# Patient Record
Sex: Female | Born: 1975 | Race: White | Hispanic: Yes | Marital: Married | Smoking: Never smoker
Health system: Southern US, Community
[De-identification: ages and names within clinical notes are randomized; demographics above are authoritative.]

## PROBLEM LIST (undated history)

## (undated) DIAGNOSIS — N809 Endometriosis, unspecified: Secondary | ICD-10-CM

---

## 2007-08-07 ENCOUNTER — Inpatient Hospital Stay (HOSPITAL_COMMUNITY): Admission: EM | Admit: 2007-08-07 | Discharge: 2007-08-11 | Payer: Self-pay | Admitting: Emergency Medicine

## 2009-06-04 ENCOUNTER — Emergency Department (HOSPITAL_COMMUNITY): Admission: EM | Admit: 2009-06-04 | Discharge: 2009-06-04 | Payer: Self-pay | Admitting: Emergency Medicine

## 2009-07-19 IMAGING — CT CT ABDOMEN W/ CM
2 of 5 series · 11 of 36 positions shown, 18 images · IV contrast (agent unspecified)
Comparison: None

CT ABDOMEN:

CLINICAL DATA: Abdominal pain radiating to back with nausea,
vomiting, diarrhea and dysuria the patient reportedly has fevers
with normal white blood count and abdominal pain.

CT ABDOMEN WITH CONTRAST,CT PELVIS WITH CONTRAST
TECHNIQUE: Multidetector CT imaging of the abdomen was performed
following the standard protocol during bolus administration of
intravenous contrast.,Technique:  Multidetector CT imaging of the
pelvis was performed following the standard protocol during
Contrast: 100 ml Wmnipaque-UII

[Series 2: routine abdomen · axial · 0.64mm/px · z∈[-438,-88]mm · 10 of 86 slices shown, 16 images]
[im 8/86  soft-tissue]
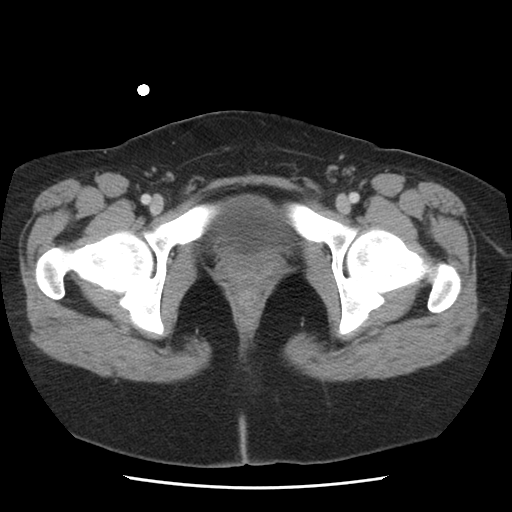
[im 8/86  bone]
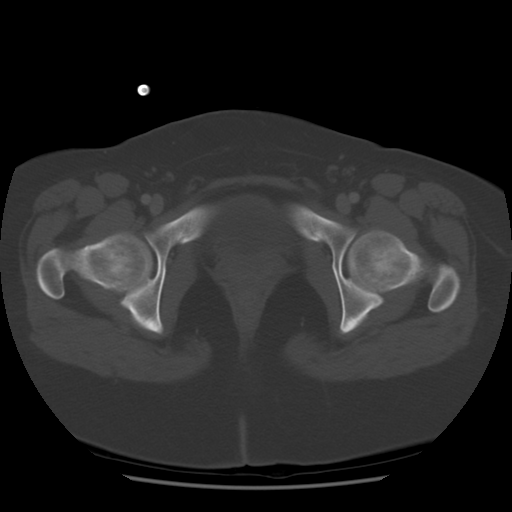
[im 15/86  soft-tissue]
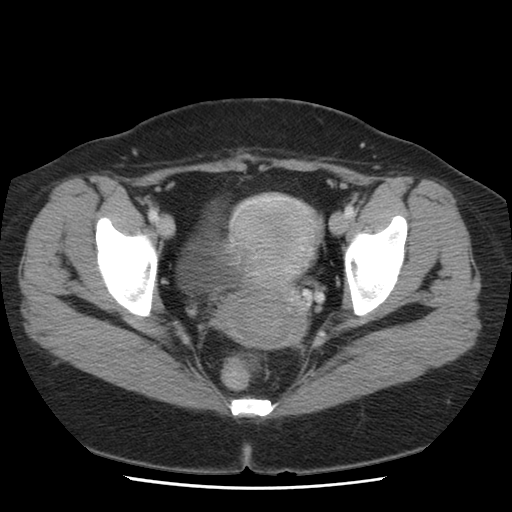
[im 22/86  soft-tissue]
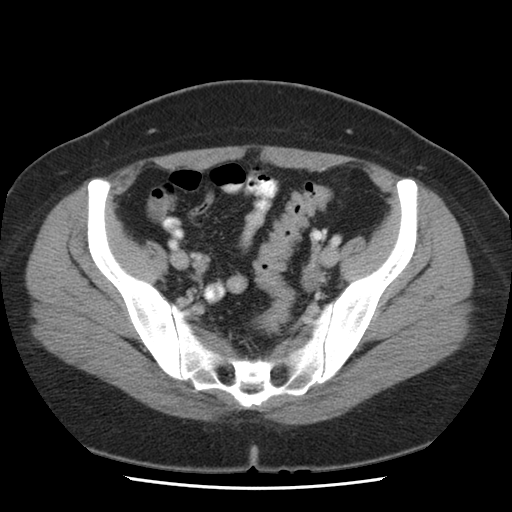
[im 29/86  soft-tissue]
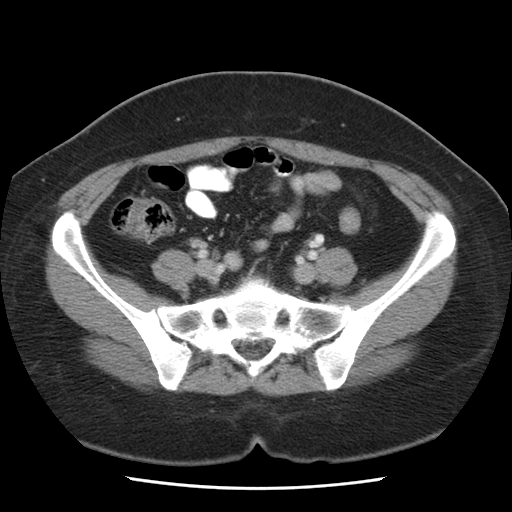
[im 36/86  soft-tissue]
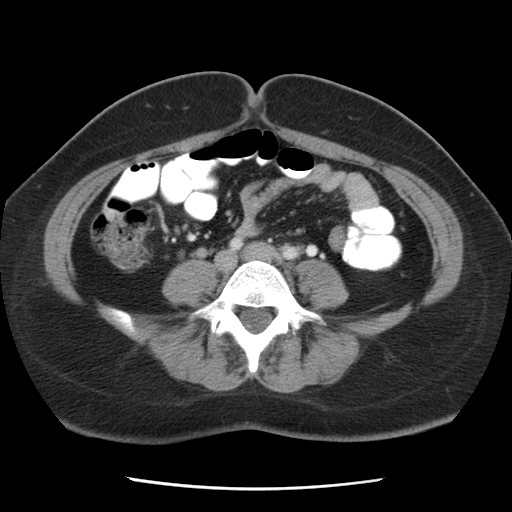
[im 50/86  soft-tissue]
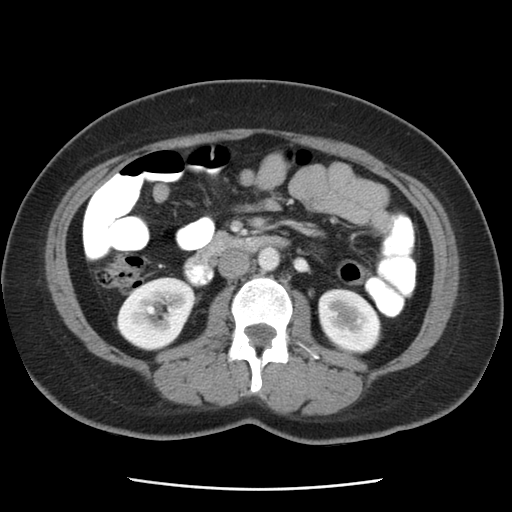
[im 57/86  soft-tissue]
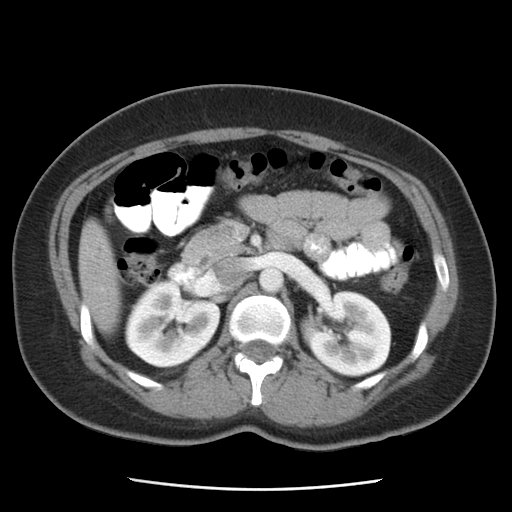
[im 57/86  lung]
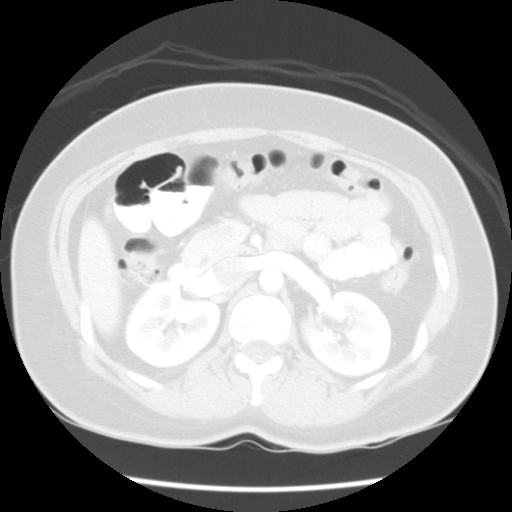
[im 64/86  soft-tissue]
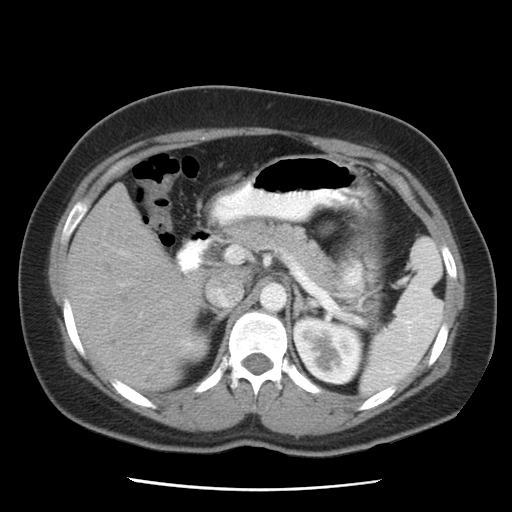
[im 64/86  lung]
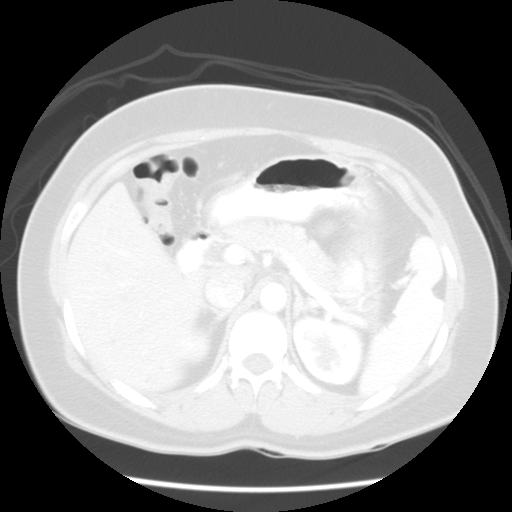
[im 71/86  soft-tissue]
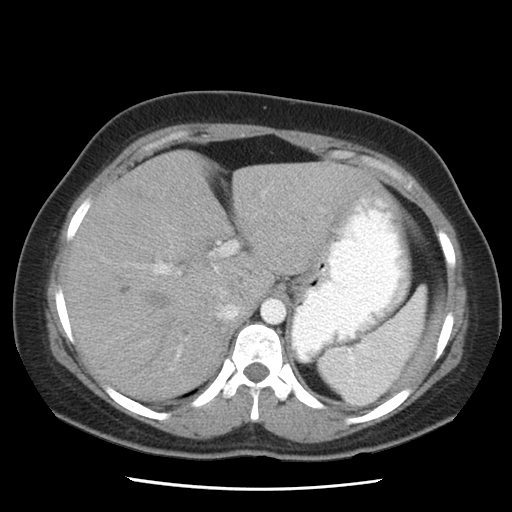
[im 71/86  lung]
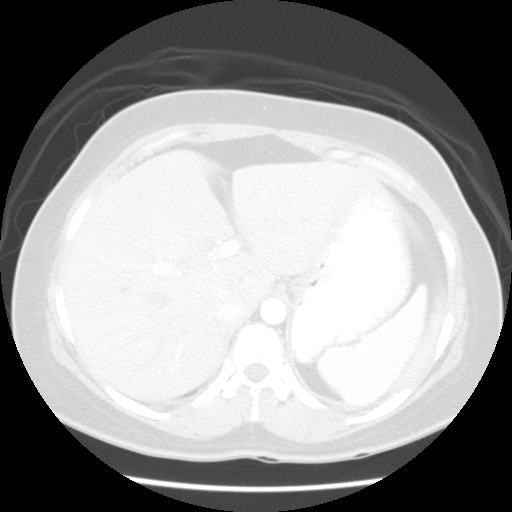
[im 71/86  bone]
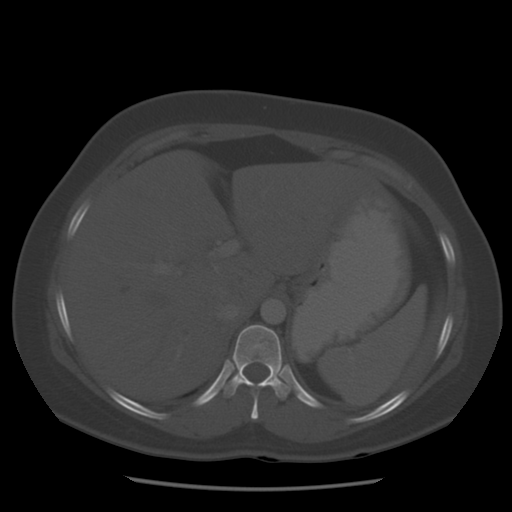
[im 78/86  soft-tissue]
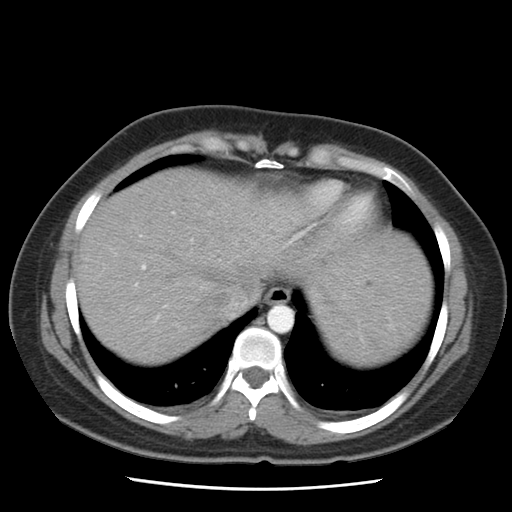
[im 78/86  lung]
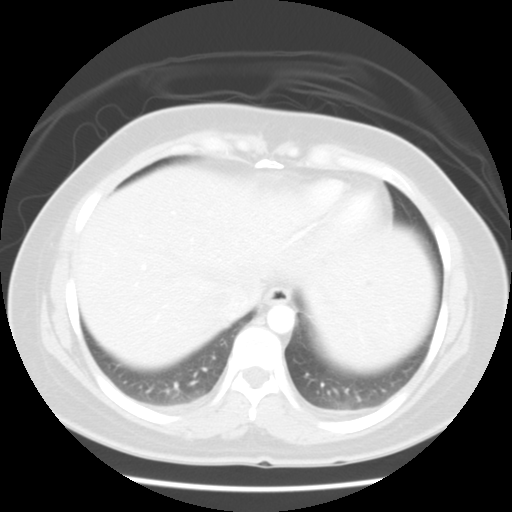

[Series 400: sag · sagittal · 0.90mm/px · 1 of 100 slices shown, 2 images]
[im 34/100  soft-tissue]
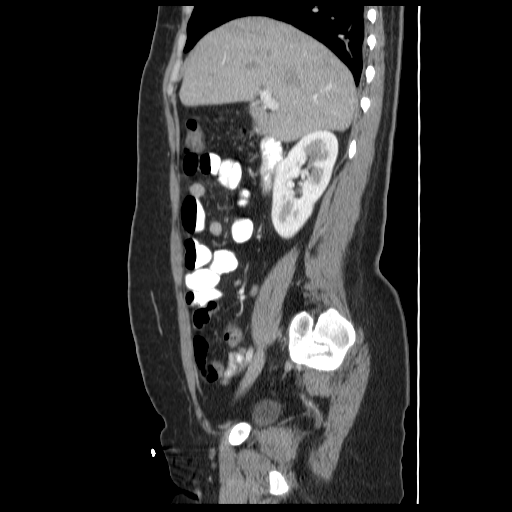
[im 34/100  bone]
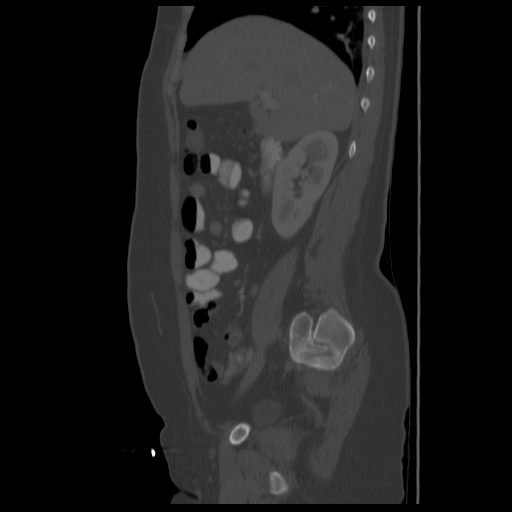

[11 of 36 positions shown; findings below may reference images not displayed]

FINDINGS: There is a calcified granuloma in the right lower lobe.
There is mild dependent atelectasis in both lower lobes.

The liver is normal in size.  There are innumerable tiny sub-
centimeter rounded  low density lesions throughout both lobes of
the liver.  There is suggestion of a enhancement along the one of
these lesions in the left lower lobe (image #11 of series 2).
There is no biliary dilatation.  The patient is status post
cholecystectomy.  The portal vein is patent.  There is no ascites.

The spleen is normal in size and enhancement.

The adrenal glands, pancreas and kidneys are normal.  Bowel loops
are not distended and there is no bowel wall thickening.  No
retroperitoneal or mesenteric adenopathy is identified.  The lower
thoracic and the lumbar spine are normal normally aligned.  There
are no focal bony lesions.  The abdominal aorta is normal in
caliber and the branch vessels are patent.  There is a small
umbilical hernia containing fat only.
IMPRESSION: 1.  Innumerable tiny low density lesions scattered throughout the
liver.  Given the patient's reported fever without definite origin
and abdominal pain, opportunistic infection is a consideration. In
particular, hepatic candidiasis can have this appearance. Question
if there is any underlying immuno compromised state.  Other
considerations include benign biliary hamartomas or sarcoidosis.
Metastatic disease and lymphoma/leukemia are thought to be less
likely given that the patient has no reported significant past
medical history.Findings discussed with Dr. Balal.

CT PELVIS:
FINDINGS: The endometrial canal of the uterus is prominent, likely
reflecting the secretory phase of the menstrual cycle in this young
patient. The ovaries and urinary bladder are within normal limits.
The appendix is normal.  There is no free fluid, abscess or
adenopathy.  The bony pelvis is unremarkable.
IMPRESSION: Pelvis CT within normal limits.

## 2010-06-13 LAB — BASIC METABOLIC PANEL
BUN: 7 mg/dL (ref 6–23)
Creatinine, Ser: 0.7 mg/dL (ref 0.4–1.2)
GFR calc Af Amer: >60 mL/min (ref >60)
Glucose, Bld: 91 mg/dL (ref 70–99)
Potassium: 3.8 mEq/L (ref 3.5–5.1)
Sodium: 138 mEq/L (ref 135–145)

## 2010-06-13 LAB — URINALYSIS, ROUTINE W REFLEX MICROSCOPIC
Glucose, UA: NEGATIVE mg/dL
Ketones, ur: NEGATIVE mg/dL

## 2010-06-13 LAB — DIFFERENTIAL
Basophils Relative: 0 % (ref 0–1)
Eosinophils Absolute: 0.1 10*3/uL (ref 0.0–0.7)
Eosinophils Relative: 1 % (ref 0–5)
Lymphocytes Relative: 32 % (ref 12–46)
Lymphs Abs: 2.1 10*3/uL (ref 0.7–4.0)
Neutro Abs: 3.8 10*3/uL (ref 1.7–7.7)

## 2010-06-13 LAB — CBC
HCT: 36.1 % (ref 36.0–46.0)
MCV: 81 fL (ref 78.0–100.0)
Platelets: 300 10*3/uL (ref 150–400)
RDW: 14.2 % (ref 11.5–15.5)
WBC: 6.5 10*3/uL (ref 4.0–10.5)

## 2010-06-13 LAB — RAPID STREP SCREEN (MED CTR MEBANE ONLY): Streptococcus, Group A Screen (Direct): NEGATIVE

## 2010-08-02 NOTE — H&P (Signed)
NAMEMADILINE, SAFFRAN NO.:  [id number]   MEDICAL RECORD NO.:  [id number]          PATIENT TYPE:  EMS   LOCATION:  MAJO                         FACILITY:  [facility]   PHYSICIAN:  Lonia Blood, M.D.DATE OF BIRTH:  1975/07/26   DATE OF ADMISSION:  08/07/2007  DATE OF DISCHARGE:                              HISTORY & PHYSICAL   PRIMARY CARE PHYSICIAN:  Unassigned.   CHIEF COMPLAINT:  Severe abdominal pain, nausea, vomiting and diarrhea.   HISTORY OF PRESENT ILLNESS:  Ms. Tina Hancock is a pleasant 35 year old  Hispanic female with no local primary care physician.  She reports that  approximately 1 week ago she spontaneously developed the acute onset of  left upper quadrant abdominal pain.  This became shortly thereafter  associated with nausea, vomiting and multiple episodes of diarrhea.  These symptoms have all continued since their onset.  They have only  worsened in character.  She denies any recent sick contacts.  She denies  any recent change in dietary intake.  She denies recent travel outside  of the country.  She denies any known chronic medical illnesses and  denies any known immunosuppressive illnesses.  She is presently lying on  a hospital stretcher in the emergency room and states that she is having  severe pain.  She has very little appetite and states that she has not  been eating or drinking much of anything for the past week due to her  symptoms.   REVIEW OF SYSTEMS:  Comprehensive review of systems otherwise  unremarkable with the exception of multiple positives  as noted in the  history of present illness above.   PAST MEDICAL HISTORY:  Status post cholecystectomy in [place] multiple  years ago.   MEDICATIONS:  None.   ALLERGIES:  No known drug allergies.   FAMILY HISTORY:  The patient's mother is alive and healthy.  The  patient's father died with pancreatic cancer but was an alcoholic.   SOCIAL HISTORY:  [place] [address] [place]  She is  married.  She has 3 healthy children.  She does not smoke.  She does not  drink.  She works in a [employer] in various different  capacities.   At the interview, hemoglobin is 12 with an MCV of 83, white count is 7,  platelet count is 293.  CMET is completely unremarkable.  Lipase is 17.  Urinalysis is negative.  Urine pregnancy test is negative.  A CT scan of  the abdomen reveals innumerable tiny low-density lesions throughout the  liver representative of possible opportunistic infection such as candida  versus hematomas versus sarcoidosis.  There is no evidence of other  intra-abdominal pathology.   PHYSICAL EXAMINATION:  VITAL SIGNS:  Temperature 97.5, blood pressure  126/82, heart rate 74, respiratory rate 18, O2 sat is 99% on room air.  GENERAL:  Well-developed, well-nourished female in no acute respiratory  distress.  HEENT:  Normocephalic, atraumatic.  Pupils equal, round, reactive to  light and accommodation.  Extraocular muscles intact.  OC/OP clear.  NECK:  No JVD.  No lymphadenopathy.  LUNGS:  Clear to auscultation bilaterally without wheezes or rhonchi.  CARDIOVASCULAR:  Regular rate and rhythm without murmur, gallop or rub.  Normal S1 and S2.  ABDOMEN:  Mildly overweight, soft.  Bowel sounds are hypoactive but  present.  The patient is exquisitely tender in the left upper quadrant  with no appreciable mass.  There is no tenderness over the right upper  quadrant, but palpation on the right does bring about some tenderness in  the left quadrant which the patient complains of.  There is no rebound.  There is no distention.  The abdomen is soft.  EXTREMITIES:  No significant cyanosis, clubbing, edema, bilateral lower  extremities.  NEUROLOGIC:  A nonfocal neurologic exam.   IMPRESSION/PLAN:  1. Intractable nausea, vomiting and diarrhea - this has all the      characteristics of a probable viral gastroenteritis.  The patient      does not,  surprisingly, appear severely volume depleted.  Given      that she cannot take any p.o. intake, however, I will administer IV      fluid and monitor her closely.  Symptom management will be      administered via IV narcotics.  Stool will be sent for routine      culture and for C. diff  toxin.  2. Innumerable hepatic lesions - the exact etiology of these is not      clear at the present time.  There is no underlying opportunistic      infection.  We are checking a C-reactive protein, a sed rate, ANA,      and HIV testing to initiate workup.  It is likely that these are      not related to the patient's presenting symptoms and are simply      hemangiomas/congenital.  In the setting of her acute illness,      however, we must consider that they are connected, and we will      consider further evaluation as indicated based upon our above-      listed lab results.      Lonia Blood, M.D.  Electronically Signed     JTM/MEDQ  D:  08/07/2007  T:  08/07/2007  Job:  [id number]

## 2010-08-05 NOTE — Discharge Summary (Signed)
Tina Hancock, Tina Hancock               ACCOUNT NO.:  [id number]   MEDICAL RECORD NO.:  [id number]          PATIENT TYPE:  INP   LOCATION:  3032                         FACILITY:  [facility]   PHYSICIAN:  Wilson Singer, M.D.DATE OF BIRTH:  05-14-1975   DATE OF ADMISSION:  08/07/2007  DATE OF DISCHARGE:  08/11/2007                               DISCHARGE SUMMARY   FINAL DISCHARGE DIAGNOSES:  1. Gastroenteritis, resolved.  2. Polycystic liver disease, asymptomatic.  The patient reassured.   MEDICATIONS ON DISCHARGE:  None.   CONDITION ON DISCHARGE:  Stable.   HISTORY:  This 35 year old lady was admitted with abdominal pain,  nausea, vomiting, and diarrhea.  Please see initial history and physical  examination done by Dr. Jetty Duhamel.   HOSPITAL PROGRESS:  The patient was admitted and treated with  intravenous fluids and antiemetics.  She also had a scan of her abdomen  which showed possible liver lesions.  She then underwent an MRI of the  liver, which confirmed the presence of polycystic liver disease.  She  continued to improve with conservative measures, and on the day of  discharge, she was tolerating a regular diet.  There was no nausea,  vomiting, or abdominal pain.  On physical examination, temperature 98,  blood pressure 95/58, pulse 62, and saturation 98% on room air.  Her  abdomen was soft and nontender.  Investigation showed a sodium of 140,  potassium 3.4, bicarbonate 25, BUN 5, creatinine 0.64.  Hemoglobin 12.4,  white blood cell count 7.8, and platelets 277.  She was able to be  discharged in a stable condition and will follow up with her primary  care physician.      Wilson Singer, M.D.  Electronically Signed     NCG/MEDQ  D:  10/11/2007  T:  10/11/2007  Job:  [id number]

## 2010-08-29 ENCOUNTER — Other Ambulatory Visit (HOSPITAL_COMMUNITY): Payer: Self-pay | Admitting: Family Medicine

## 2010-08-29 DIAGNOSIS — R1011 Right upper quadrant pain: Secondary | ICD-10-CM

## 2010-08-29 DIAGNOSIS — R1031 Right lower quadrant pain: Secondary | ICD-10-CM

## 2010-08-30 ENCOUNTER — Ambulatory Visit (HOSPITAL_COMMUNITY): Payer: Self-pay

## 2010-09-02 ENCOUNTER — Other Ambulatory Visit (HOSPITAL_COMMUNITY): Payer: Self-pay

## 2010-12-14 LAB — DIFFERENTIAL
Eosinophils Relative: 1
Lymphocytes Relative: 31

## 2010-12-14 LAB — PREGNANCY, URINE: Preg Test, Ur: NEGATIVE

## 2010-12-14 LAB — COMPREHENSIVE METABOLIC PANEL
ALT: 18
AST: 18
Albumin: 3.6
Alkaline Phosphatase: 62
BUN: 5 — ABNORMAL LOW
BUN: 6
CO2: 24
Calcium: 8.4
Calcium: 9
Chloride: 109
Chloride: 110
Creatinine, Ser: 0.64
GFR calc Af Amer: >60
GFR calc non Af Amer: >60
Glucose, Bld: 88
Potassium: 3.8
Sodium: 138
Total Bilirubin: 0.6
Total Bilirubin: 0.8

## 2010-12-14 LAB — BASIC METABOLIC PANEL
BUN: <1 — ABNORMAL LOW
CO2: 25
CO2: 27
Calcium: 8.4
Chloride: 110
Creatinine, Ser: 0.61
Creatinine, Ser: 0.65
GFR calc Af Amer: >60
GFR calc Af Amer: >60
Potassium: 4.1
Sodium: 140

## 2010-12-14 LAB — CBC
HCT: 35.4 — ABNORMAL LOW
HCT: 37.2
Hemoglobin: 11.8 — ABNORMAL LOW
MCHC: 33.3
MCHC: 33.3
MCHC: 33.6
MCHC: 34.3
MCV: 83.8
MCV: 84.1
Platelets: 246
Platelets: 277
Platelets: 293
RBC: 3.91
RBC: 4.22
RDW: 13.9
RDW: 13.9
WBC: 5.4
WBC: 6.3
WBC: 7

## 2010-12-14 LAB — CULTURE, BLOOD (ROUTINE X 2)

## 2010-12-14 LAB — ANA: Anti Nuclear Antibody(ANA): NEGATIVE

## 2010-12-14 LAB — LIPID PANEL
Cholesterol: 163
HDL: 30 — ABNORMAL LOW
LDL Cholesterol: 112 — ABNORMAL HIGH
VLDL: 21

## 2010-12-14 LAB — URINALYSIS, ROUTINE W REFLEX MICROSCOPIC
Bilirubin Urine: NEGATIVE
Glucose, UA: NEGATIVE
Hgb urine dipstick: NEGATIVE
Ketones, ur: NEGATIVE
Nitrite: NEGATIVE
Specific Gravity, Urine: 1.012

## 2010-12-14 LAB — CLOSTRIDIUM DIFFICILE EIA

## 2010-12-14 LAB — TSH: TSH: 1.199

## 2010-12-14 LAB — C-REACTIVE PROTEIN: CRP: 0.3 — ABNORMAL LOW (ref <0.6)

## 2010-12-14 LAB — B-NATRIURETIC PEPTIDE (CONVERTED LAB): Pro B Natriuretic peptide (BNP): 188 — ABNORMAL HIGH

## 2010-12-14 LAB — GIARDIA/CRYPTOSPORIDIUM SCREEN(EIA)

## 2010-12-14 LAB — LIPASE, BLOOD: Lipase: 17

## 2016-01-17 ENCOUNTER — Other Ambulatory Visit (HOSPITAL_COMMUNITY): Payer: Self-pay | Admitting: Nurse Practitioner

## 2016-01-17 DIAGNOSIS — N921 Excessive and frequent menstruation with irregular cycle: Secondary | ICD-10-CM

## 2016-01-17 DIAGNOSIS — N946 Dysmenorrhea, unspecified: Secondary | ICD-10-CM

## 2016-01-17 DIAGNOSIS — N941 Unspecified dyspareunia: Secondary | ICD-10-CM

## 2016-01-25 ENCOUNTER — Ambulatory Visit (HOSPITAL_COMMUNITY)
Admission: RE | Admit: 2016-01-25 | Discharge: 2016-01-25 | Disposition: A | Discharge status: Home / Self Care | Payer: Self-pay | Source: Ambulatory Visit | Attending: Nurse Practitioner | Admitting: Nurse Practitioner

## 2016-01-25 DIAGNOSIS — N941 Unspecified dyspareunia: Secondary | ICD-10-CM

## 2016-01-25 DIAGNOSIS — D259 Leiomyoma of uterus, unspecified: Secondary | ICD-10-CM | POA: Insufficient documentation

## 2016-01-25 DIAGNOSIS — N946 Dysmenorrhea, unspecified: Secondary | ICD-10-CM | POA: Insufficient documentation

## 2016-01-25 DIAGNOSIS — N921 Excessive and frequent menstruation with irregular cycle: Secondary | ICD-10-CM | POA: Insufficient documentation

## 2016-04-28 ENCOUNTER — Emergency Department (HOSPITAL_COMMUNITY)
Admission: EM | Admit: 2016-04-28 | Discharge: 2016-04-28 | Disposition: A | Discharge status: Home / Self Care | Payer: Self-pay | Attending: Emergency Medicine | Admitting: Emergency Medicine

## 2016-04-28 ENCOUNTER — Encounter (HOSPITAL_COMMUNITY): Payer: Self-pay | Admitting: Emergency Medicine

## 2016-04-28 ENCOUNTER — Emergency Department (HOSPITAL_COMMUNITY): Payer: Self-pay

## 2016-04-28 DIAGNOSIS — N939 Abnormal uterine and vaginal bleeding, unspecified: Secondary | ICD-10-CM | POA: Insufficient documentation

## 2016-04-28 DIAGNOSIS — E876 Hypokalemia: Secondary | ICD-10-CM | POA: Insufficient documentation

## 2016-04-28 DIAGNOSIS — Z79899 Other long term (current) drug therapy: Secondary | ICD-10-CM | POA: Insufficient documentation

## 2016-04-28 DIAGNOSIS — K529 Noninfective gastroenteritis and colitis, unspecified: Secondary | ICD-10-CM | POA: Insufficient documentation

## 2016-04-28 DIAGNOSIS — R1084 Generalized abdominal pain: Secondary | ICD-10-CM

## 2016-04-28 HISTORY — DX: Endometriosis, unspecified: N80.9

## 2016-04-28 LAB — URINALYSIS, ROUTINE W REFLEX MICROSCOPIC
Bilirubin Urine: NEGATIVE
Glucose, UA: NEGATIVE mg/dL
Ketones, ur: 20 mg/dL — AB
Leukocytes, UA: NEGATIVE
Nitrite: NEGATIVE
PH: 5 (ref 5.0–8.0)
Protein, ur: 30 mg/dL — AB
Specific Gravity, Urine: 1.027 (ref 1.005–1.030)

## 2016-04-28 LAB — COMPREHENSIVE METABOLIC PANEL
ALK PHOS: 76 U/L (ref 38–126)
ALT: 24 U/L (ref 14–54)
ANION GAP: 7 (ref 5–15)
AST: 25 U/L (ref 15–41)
Albumin: 4.3 g/dL (ref 3.5–5.0)
BILIRUBIN TOTAL: 0.4 mg/dL (ref 0.3–1.2)
BUN: 10 mg/dL (ref 6–20)
CALCIUM: 8.3 mg/dL — AB (ref 8.9–10.3)
CO2: 22 mmol/L (ref 22–32)
CREATININE: 0.68 mg/dL (ref 0.44–1.00)
Chloride: 103 mmol/L (ref 101–111)
GFR calc non Af Amer: >60 mL/min (ref >60)
Glucose, Bld: 114 mg/dL — ABNORMAL HIGH (ref 65–99)
Potassium: 2.9 mmol/L — ABNORMAL LOW (ref 3.5–5.1)
SODIUM: 132 mmol/L — AB (ref 135–145)
TOTAL PROTEIN: 7.9 g/dL (ref 6.5–8.1)

## 2016-04-28 LAB — CBC
HCT: 36.9 % (ref 36.0–46.0)
HEMOGLOBIN: 11.6 g/dL — AB (ref 12.0–15.0)
MCH: 23.1 pg — ABNORMAL LOW (ref 26.0–34.0)
MCHC: 31.4 g/dL (ref 30.0–36.0)
MCV: 73.5 fL — ABNORMAL LOW (ref 78.0–100.0)
PLATELETS: 418 10*3/uL — AB (ref 150–400)
RBC: 5.02 MIL/uL (ref 3.87–5.11)
RDW: 16.1 % — ABNORMAL HIGH (ref 11.5–15.5)
WBC: 8.8 10*3/uL (ref 4.0–10.5)

## 2016-04-28 LAB — LIPASE, BLOOD: Lipase: 13 U/L (ref 11–51)

## 2016-04-28 LAB — PREGNANCY, URINE: Preg Test, Ur: NEGATIVE

## 2016-04-28 MED ORDER — POTASSIUM CHLORIDE CRYS ER 20 MEQ PO TBCR
40.0000 meq | EXTENDED_RELEASE_TABLET | Freq: Once | ORAL | Status: AC
  Administered 2016-04-28: 40 meq via ORAL
  Filled 2016-04-28: qty 2

## 2016-04-28 MED ORDER — POTASSIUM CHLORIDE ER 10 MEQ PO TBCR: 10.0000 meq | EXTENDED_RELEASE_TABLET | Freq: Two times a day (BID) | ORAL | 0 refills | Status: AC

## 2016-04-28 MED ORDER — HYDROCODONE-ACETAMINOPHEN 5-325 MG PO TABS: 1.0000 | ORAL_TABLET | Freq: Four times a day (QID) | ORAL | 0 refills | Status: AC | PRN

## 2016-04-28 MED ORDER — LOPERAMIDE HCL 2 MG PO TABS: 2.0000 mg | ORAL_TABLET | Freq: Four times a day (QID) | ORAL | 0 refills | Status: AC | PRN

## 2016-04-28 MED ORDER — ONDANSETRON HCL 4 MG/2ML IJ SOLN
4.0000 mg | Freq: Once | INTRAMUSCULAR | Status: AC
  Administered 2016-04-28: 4 mg via INTRAVENOUS
  Filled 2016-04-28: qty 2

## 2016-04-28 MED ORDER — IOPAMIDOL (ISOVUE-300) INJECTION 61%
100.0000 mL | Freq: Once | INTRAVENOUS | Status: AC | PRN
  Administered 2016-04-28: 100 mL via INTRAVENOUS

## 2016-04-28 MED ORDER — SODIUM CHLORIDE 0.9 % IV BOLUS (SEPSIS)
1000.0000 mL | Freq: Once | INTRAVENOUS | Status: AC
  Administered 2016-04-28: 1000 mL via INTRAVENOUS

## 2016-04-28 MED ORDER — HYDROMORPHONE HCL 1 MG/ML IJ SOLN
1.0000 mg | Freq: Once | INTRAMUSCULAR | Status: AC
  Administered 2016-04-28: 1 mg via INTRAVENOUS
  Filled 2016-04-28: qty 1

## 2016-04-28 MED ORDER — SODIUM CHLORIDE 0.9 % IV SOLN
INTRAVENOUS | Status: DC
  Administered 2016-04-28: 20:00:00 via INTRAVENOUS

## 2016-04-28 MED ORDER — PROMETHAZINE HCL 25 MG PO TABS: 25.0000 mg | ORAL_TABLET | Freq: Four times a day (QID) | ORAL | 1 refills | Status: AC | PRN

## 2016-04-28 MED ORDER — IOPAMIDOL (ISOVUE-300) INJECTION 61%
INTRAVENOUS | Status: AC
  Administered 2016-04-28: 30 mL
  Filled 2016-04-28: qty 30

## 2016-04-28 MED ORDER — HYDROMORPHONE HCL 2 MG/ML IJ SOLN: 2.0000 mg | Freq: Once | INTRAMUSCULAR | Status: DC

## 2016-04-28 NOTE — ED Notes (Signed)
Pt brought back from ct and vomited. EDP Zakowski made aware and oredes for Zofran were received.

## 2016-04-28 NOTE — Discharge Instructions (Signed)
Take antinausea medicine and Phenergan as directed. Take the Imodium for the diarrhea as directed. Take the pain medicine as needed. Take the potassium twice a day for the next couple days. Your potassium level was slightly low. CT scan of your abdomen was normal. Suspect that this is a viral stomach bug. Return for any new or worse symptoms. Work note provided.

## 2016-04-28 NOTE — ED Triage Notes (Signed)
Pt reports N V abd pain since last pm - painful urination  Reports that her whole body is cramping

## 2016-04-28 NOTE — ED Notes (Signed)
Patient transported to CT 

## 2016-04-28 NOTE — ED Provider Notes (Signed)
[facility] DEPT Provider Note   CSN: [id number] Arrival date & time: 04/28/16  1331     History   Chief Complaint Chief Complaint  Patient presents with  . Influenza    with abd pain    HPI Tina Hancock is a 41 y.o. female.  Patient with acute onset of nausea vomiting and diarrhea last evening several episodes more than 10 no blood in either one. Patient is on her menses with vaginal bleeding currently. Associated with right-sided abdominal pain that has been severe. No sick contacts.      Past Medical History:  Diagnosis Date  . Endometriosis     There are no active problems to display for this patient.   History reviewed. No pertinent surgical history.  OB History    No data available       Home Medications    Prior to Admission medications   Medication Sig Start Date End Date Taking? Authorizing Provider  ferrous sulfate 325 (65 FE) MG EC tablet Take 325 mg by mouth daily.    Yes Historical Provider, MD  Multiple Vitamin (MULTIVITAMIN WITH MINERALS) TABS tablet Take 1 tablet by mouth daily.   Yes Historical Provider, MD  HYDROcodone-acetaminophen (NORCO/VICODIN) 5-325 MG tablet Take 1-2 tablets by mouth every 6 (six) hours as needed. 04/28/16   Fredia Sorrow, MD  loperamide (IMODIUM A-D) 2 MG tablet Take 1 tablet (2 mg total) by mouth 4 (four) times daily as needed for diarrhea or loose stools. 04/28/16   Fredia Sorrow, MD  potassium chloride (K-DUR) 10 MEQ tablet Take 1 tablet (10 mEq total) by mouth 2 (two) times daily. 04/28/16   Fredia Sorrow, MD  promethazine (PHENERGAN) 25 MG tablet Take 1 tablet (25 mg total) by mouth every 6 (six) hours as needed. 04/28/16   Fredia Sorrow, MD    Family History No family history on file.  Social History Social History  Substance Use Topics  . Smoking status: Never Smoker  . Smokeless tobacco: Never Used  . Alcohol use No     Allergies   Patient has no known allergies.   Review of Systems Review of  Systems  Constitutional: Negative for fever.  HENT: Negative for congestion.   Eyes: Negative for redness.  Respiratory: Negative for shortness of breath.   Cardiovascular: Negative for chest pain.  Gastrointestinal: Positive for abdominal pain, diarrhea, nausea and vomiting. Negative for blood in stool.  Genitourinary: Positive for vaginal bleeding. Negative for dysuria.  Musculoskeletal: Negative for back pain.  Neurological: Negative for headaches.  Hematological: Does not bruise/bleed easily.     Physical Exam Updated Vital Signs BP 128/70   Pulse 85   Temp 98.8 F (37.1 C) (Oral)   Resp 20   Ht 5' (1.524 m)   Wt 79.8 kg   LMP 04/28/2016   SpO2 100%   BMI 34.37 kg/m   Physical Exam  Constitutional: She is oriented to person, place, and time. She appears well-developed and well-nourished. No distress.  HENT:  Head: Normocephalic and atraumatic.  Mouth/Throat: Oropharynx is clear and moist.  Eyes: EOM are normal. Pupils are equal, round, and reactive to light.  Neck: Normal range of motion. Neck supple.  Cardiovascular: Normal rate, regular rhythm and normal heart sounds.   Pulmonary/Chest: Effort normal and breath sounds normal.  Abdominal: Soft. Bowel sounds are normal. There is tenderness.  Right-sided the abdomen without guarding.  Musculoskeletal: Normal range of motion.  Neurological: She is alert and oriented to person, place, and  time. No cranial nerve deficit or sensory deficit. She exhibits normal muscle tone. Coordination normal.  Skin: Skin is warm.  Nursing note and vitals reviewed.    ED Treatments / Results  Labs (all labs ordered are listed, but only abnormal results are displayed) Labs Reviewed  COMPREHENSIVE METABOLIC PANEL - Abnormal; Notable for the following:       Result Value   Sodium 132 (*)    Potassium 2.9 (*)    Glucose, Bld 114 (*)    Calcium 8.3 (*)    All other components within normal limits  CBC - Abnormal; Notable for the  following:    Hemoglobin 11.6 (*)    MCV 73.5 (*)    MCH 23.1 (*)    RDW 16.1 (*)    Platelets 418 (*)    All other components within normal limits  URINALYSIS, ROUTINE W REFLEX MICROSCOPIC - Abnormal; Notable for the following:    APPearance HAZY (*)    Hgb urine dipstick MODERATE (*)    Ketones, ur 20 (*)    Protein, ur 30 (*)    Bacteria, UA RARE (*)    All other components within normal limits  LIPASE, BLOOD  PREGNANCY, URINE    EKG  EKG Interpretation None       Radiology Ct Abdomen Pelvis W Contrast  Result Date: 04/28/2016 CLINICAL DATA:  Right lower quadrant pain, nausea, vomiting, and is urea. EXAM: CT ABDOMEN AND PELVIS WITH CONTRAST TECHNIQUE: Multidetector CT imaging of the abdomen and pelvis was performed using the standard protocol following bolus administration of intravenous contrast. CONTRAST:  100 mL Isovue-300 COMPARISON:  Pelvic ultrasound 01/25/2016. Abdominal MRI 08/09/2007. CT abdomen and pelvis 08/07/2007. FINDINGS: The pelvis was initially incompletely imaged as the patient began vomiting. Additional scanning was subsequently performed through the remainder of the pelvis. Lower chest: Unchanged 5 mm calcified nodule in the right lower lobe. Hepatobiliary: Innumerable low-density lesions throughout the liver have increased in size and number compared to the prior CT. A conglomeration of several adjacent lesions in the posterior right hepatic lobe measures 3.2 cm. The gallbladder is surgically absent. No biliary dilatation is seen. Pancreas: Unremarkable. Spleen: Unremarkable. Adrenals/Urinary Tract: Unremarkable adrenal glands. There is a new focal area of parenchymal volume loss/ scarring in the upper pole of the right kidney. No renal mass or hydronephrosis is seen. Excreted contrast is noted in the bladder due to delayed scanning through the lower pelvis. Stomach/Bowel: The stomach is within normal limits. The appendix is unremarkable. There is no evidence of  bowel obstruction. There are a few loops of mildly prominent but nondilated small bowel in the right mid abdomen containing air-fluid levels, and there is question of mild small bowel wall thickening more proximally though evaluation is limited by underdistention. Vascular/Lymphatic: No significant vascular findings. No enlarged lymph nodes. Reproductive: Anterior uterine fibroid as seen on ultrasound. No adnexal mass. Other: No intraperitoneal free fluid. Small fat containing umbilical hernia. Musculoskeletal: No acute osseous abnormality or suspicious osseous lesion. IMPRESSION: 1. Normal appendix. 2. Possible mild changes of enteritis. 3. Increased size and number of hepatic cysts. 4. 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 Electronically Signed   By: Logan Bores M.D.   On: 04/28/2016 21:07    Procedures Procedures (including critical care time)  Medications Ordered in ED Medications  0.9 %  sodium chloride infusion ( Intravenous New Bag/Given 04/28/16 2027)  ondansetron (ZOFRAN) injection 4 mg (not administered)  HYDROmorphone (DILAUDID) injection 1 mg (  not administered)  ondansetron (ZOFRAN) injection 4 mg (4 mg Intravenous Given 04/28/16 1804)  sodium chloride 0.9 % bolus 1,000 mL (0 mLs Intravenous Stopped 04/28/16 2013)  potassium chloride SA (K-DUR,KLOR-CON) CR tablet 40 mEq (40 mEq Oral Given 04/28/16 1804)  HYDROmorphone (DILAUDID) injection 1 mg (1 mg Intravenous Given 04/28/16 1804)  iopamidol (ISOVUE-300) 61 % injection 100 mL (100 mLs Intravenous Contrast Given 04/28/16 1954)  iopamidol (ISOVUE-300) 61 % injection (30 mLs  Contrast Given 04/28/16 1955)  ondansetron (ZOFRAN) injection 4 mg (4 mg Intravenous Given 04/28/16 2027)  HYDROmorphone (DILAUDID) injection 1 mg (1 mg Intravenous Given 04/28/16 2057)     Initial Impression / Assessment and Plan / ED Course  I have reviewed the triage vital signs and the nursing notes.  Pertinent labs & imaging results that were  available during my care of the patient were reviewed by me and considered in my medical decision making (see chart for details).    Patient symptoms consistent with a gastroenteritis with multiple episodes of vomiting and diarrhea starting yesterday. The patient was having significant right-sided abdominal pain so CT scan was done. Showed no acute abdominal process. Labs without significant abnormalities. Patient shows improvement here with IV fluids antinausea medicine and pain medicine. Potassium was low which is consistent with all the diarrhea. Patient given some oral potassium here and we continue with potassium at home. Patient also treated with Phenergan and Imodium. Patient will return for any new or worse symptoms. Patient with only 1 further episode of vomiting after arrival here.   Final Clinical Impressions(s) / ED Diagnoses   Final diagnoses:  Generalized abdominal pain  Hypokalemia  Gastroenteritis    New Prescriptions New Prescriptions   HYDROCODONE-ACETAMINOPHEN (NORCO/VICODIN) 5-325 MG TABLET    Take 1-2 tablets by mouth every 6 (six) hours as needed.   LOPERAMIDE (IMODIUM A-D) 2 MG TABLET    Take 1 tablet (2 mg total) by mouth 4 (four) times daily as needed for diarrhea or loose stools.   POTASSIUM CHLORIDE (K-DUR) 10 MEQ TABLET    Take 1 tablet (10 mEq total) by mouth 2 (two) times daily.   PROMETHAZINE (PHENERGAN) 25 MG TABLET    Take 1 tablet (25 mg total) by mouth every 6 (six) hours as needed.     Fredia Sorrow, MD 04/28/16 [phone]
